# Patient Record
Sex: Male | Born: 2000 | Hispanic: Yes | Marital: Single | Smoking: Never smoker
Health system: Southern US, Community
[De-identification: ages and names within clinical notes are randomized; demographics above are authoritative.]

---
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
## 2022-07-01 ENCOUNTER — Ambulatory Visit
Admission: EM | Admit: 2022-07-01 | Discharge: 2022-07-01 | Disposition: A | Discharge status: Home / Self Care | Payer: Self-pay | Attending: Family Medicine | Admitting: Family Medicine

## 2022-07-01 ENCOUNTER — Encounter: Payer: Self-pay | Admitting: *Deleted

## 2022-07-01 ENCOUNTER — Ambulatory Visit: Payer: Self-pay

## 2022-07-01 ENCOUNTER — Other Ambulatory Visit: Payer: Self-pay

## 2022-07-01 DIAGNOSIS — S61012A Laceration without foreign body of left thumb without damage to nail, initial encounter: Secondary | ICD-10-CM

## 2022-07-01 DIAGNOSIS — Z23 Encounter for immunization: Secondary | ICD-10-CM

## 2022-07-01 MED ORDER — TETANUS-DIPHTH-ACELL PERTUSSIS 5-2.5-18.5 LF-MCG/0.5 IM SUSY
0.5000 mL | PREFILLED_SYRINGE | Freq: Once | INTRAMUSCULAR | Status: AC
  Administered 2022-07-01: 0.5 mL via INTRAMUSCULAR

## 2022-07-01 NOTE — Discharge Instructions (Addendum)
Take over the counter meds as needed for pain. Continue cleaning wound clean.  It will heal with time.

## 2022-07-01 NOTE — ED Triage Notes (Signed)
Pt reports he cut his Lt thumb on Friday while cooking. Pt unsure when last tetanus vac was.

## 2022-07-01 NOTE — ED Provider Notes (Signed)
[facility]    CSN: [id number] Arrival date & time: 07/01/22  0950      History   Chief Complaint Chief Complaint  Patient presents with   Laceration    HPI Andrew Faulkner is a 21 y.o. male.   HPI  Andrew Faulkner presents for left thumb laceration that occurred while cutting up vegetables for soup 4 to 5 days ago.  Injury occurred around 7 PM. States it was it bleed for about a while and he held his arm above his head and but a bandage on it. He was able to remove the bandage 4 hours later.   He cleaned injury with high Hibiclens and antiseptic wash.  Has some mild pain when touching the area but denies pain with movement and at rest.  Has some mild numbness at the tip. Denies nausea, vomiting, hand weakness, tingling. He has never injured this thumb previously.  He is right handed.     History reviewed. No pertinent past medical history.  There are no problems to display for this patient.   History reviewed. No pertinent surgical history.     Home Medications    Prior to Admission medications   Medication Sig Start Date End Date Taking? Authorizing Provider  ibuprofen (ADVIL,MOTRIN) 200 MG tablet Take 200 mg by mouth every 8 (eight) hours as needed.    [provider]  loratadine (CLARITIN) 10 MG tablet Take 10 mg by mouth daily.    [provider]    Family History History reviewed. No pertinent family history.  Social History Social History   Tobacco Use   Smoking status: Never   Smokeless tobacco: Never     Allergies   Penicillins   Review of Systems Review of Systems : :negative unless otherwise stated in HPI.      Physical Exam Triage Vital Signs ED Triage Vitals  Enc Vitals Group     BP 07/01/22 0957 118/62     Pulse Rate 07/01/22 0957 77     Resp 07/01/22 0957 18     Temp 07/01/22 0957 98.1 F (36.7 C)     Temp src --      SpO2 07/01/22 0957 100 %     Weight --      Height --      Head Circumference --       Peak Flow --      Pain Score 07/01/22 0959 2     Pain Loc --      Pain Edu? --      Excl. in GC? --    No data found.  Updated Vital Signs BP 118/62   Pulse 77   Temp 98.1 F (36.7 C)   Resp 18   SpO2 100%   Visual Acuity Right Eye Distance:   Left Eye Distance:   Bilateral Distance:    Right Eye Near:   Left Eye Near:    Bilateral Near:     Physical Exam  GEN: well appearing male in no acute distress  CVS: well perfused  RESP: speaking in full sentences without pause, no respiratory distress  MSK: Left thumb: Full active and passive range of motion without pain, strength 5/5 in all directions, no metacarpal or carpal tenderness, gross sensation intact, radial pulse palpable  SKIN: 1.5 cm laceration to the distal left thumb, it appears to have healed somewhat by secondary intention     UC Treatments / Results  Labs (all labs ordered are listed, but  only abnormal results are displayed) Labs Reviewed - No data to display  EKG   Radiology No results found.  Procedures Procedures (including critical care time)  Medications Ordered in UC Medications  Tdap (BOOSTRIX) injection 0.5 mL (0.5 mLs Intramuscular Given 07/01/22 1031)    Initial Impression / Assessment and Plan / UC Course  I have reviewed the triage vital signs and the nursing notes.  Pertinent labs & imaging results that were available during my care of the patient were reviewed by me and considered in my medical decision making (see chart for details).     Pt is a 21 y.o. male who presents after injuring left thumb at home 5 days ago.  Angulated laceration was not repaired and is healing by secondary intention.  Home care instructions provided. OTC analgesics as needed for pain.  Tetanus updated prior to discharge.      Final Clinical Impressions(s) / UC Diagnoses   Final diagnoses:  Laceration of left thumb, foreign body presence unspecified, nail damage status unspecified, initial encounter      Discharge Instructions      Take over the counter meds as needed for pain. Continue cleaning wound clean.  It will heal with time.      ED Prescriptions   None    PDMP not reviewed this encounter.   Katha Cabal, DO 07/01/22 1110
